# Patient Record
Sex: Female | Born: 1960 | Race: Black or African American | Hispanic: No | Smoking: Never smoker
Health system: Southern US, Community
[De-identification: ages and names within clinical notes are randomized; demographics above are authoritative.]

## PROBLEM LIST (undated history)

## (undated) DIAGNOSIS — I10 Essential (primary) hypertension: Secondary | ICD-10-CM

## (undated) DIAGNOSIS — E119 Type 2 diabetes mellitus without complications: Secondary | ICD-10-CM

## (undated) HISTORY — PX: ABDOMINAL HYSTERECTOMY: SHX81

---

## 1997-04-04 ENCOUNTER — Ambulatory Visit (HOSPITAL_COMMUNITY): Admission: RE | Admit: 1997-04-04 | Discharge: 1997-04-04 | Payer: Self-pay | Admitting: Obstetrics and Gynecology

## 1997-10-28 ENCOUNTER — Emergency Department (HOSPITAL_COMMUNITY): Admission: EM | Admit: 1997-10-28 | Discharge: 1997-10-28 | Payer: Self-pay | Admitting: Emergency Medicine

## 1997-12-19 ENCOUNTER — Other Ambulatory Visit: Admission: RE | Admit: 1997-12-19 | Discharge: 1997-12-19 | Payer: Self-pay | Admitting: Obstetrics and Gynecology

## 1998-01-03 ENCOUNTER — Ambulatory Visit (HOSPITAL_COMMUNITY): Admission: RE | Admit: 1998-01-03 | Discharge: 1998-01-03 | Payer: Self-pay | Admitting: Obstetrics and Gynecology

## 1998-12-03 ENCOUNTER — Other Ambulatory Visit: Admission: RE | Admit: 1998-12-03 | Discharge: 1998-12-03 | Payer: Self-pay | Admitting: Obstetrics and Gynecology

## 1999-12-13 ENCOUNTER — Other Ambulatory Visit: Admission: RE | Admit: 1999-12-13 | Discharge: 1999-12-13 | Payer: Self-pay | Admitting: Obstetrics and Gynecology

## 2000-01-14 ENCOUNTER — Encounter: Payer: Self-pay | Admitting: Obstetrics and Gynecology

## 2000-01-21 ENCOUNTER — Encounter (INDEPENDENT_AMBULATORY_CARE_PROVIDER_SITE_OTHER): Payer: Self-pay

## 2000-01-21 ENCOUNTER — Inpatient Hospital Stay (HOSPITAL_COMMUNITY): Admission: RE | Admit: 2000-01-21 | Discharge: 2000-01-23 | Payer: Self-pay | Admitting: Obstetrics and Gynecology

## 2000-01-25 ENCOUNTER — Inpatient Hospital Stay (HOSPITAL_COMMUNITY): Admission: EM | Admit: 2000-01-25 | Discharge: 2000-01-27 | Payer: Self-pay | Admitting: Emergency Medicine

## 2000-01-25 ENCOUNTER — Encounter: Payer: Self-pay | Admitting: Obstetrics and Gynecology

## 2000-01-25 ENCOUNTER — Encounter: Payer: Self-pay | Admitting: Emergency Medicine

## 2002-07-06 ENCOUNTER — Encounter: Payer: Self-pay | Admitting: Family Medicine

## 2002-07-06 ENCOUNTER — Encounter: Admission: RE | Admit: 2002-07-06 | Discharge: 2002-07-06 | Payer: Self-pay | Admitting: Family Medicine

## 2003-03-11 ENCOUNTER — Emergency Department (HOSPITAL_COMMUNITY): Admission: EM | Admit: 2003-03-11 | Discharge: 2003-03-12 | Payer: Self-pay | Admitting: Emergency Medicine

## 2004-03-05 ENCOUNTER — Encounter: Admission: RE | Admit: 2004-03-05 | Discharge: 2004-03-05 | Payer: Self-pay | Admitting: Family Medicine

## 2005-04-14 ENCOUNTER — Encounter: Admission: RE | Admit: 2005-04-14 | Discharge: 2005-04-14 | Payer: Self-pay | Admitting: Family Medicine

## 2005-07-18 ENCOUNTER — Encounter: Admission: RE | Admit: 2005-07-18 | Discharge: 2005-07-18 | Payer: Self-pay | Admitting: Family Medicine

## 2005-10-08 ENCOUNTER — Other Ambulatory Visit: Admission: RE | Admit: 2005-10-08 | Discharge: 2005-10-08 | Payer: Self-pay | Admitting: Obstetrics and Gynecology

## 2006-04-20 ENCOUNTER — Encounter: Admission: RE | Admit: 2006-04-20 | Discharge: 2006-04-20 | Payer: Self-pay | Admitting: Family Medicine

## 2007-04-12 ENCOUNTER — Encounter: Admission: RE | Admit: 2007-04-12 | Discharge: 2007-04-12 | Payer: Self-pay | Admitting: Family Medicine

## 2008-04-13 ENCOUNTER — Encounter: Admission: RE | Admit: 2008-04-13 | Discharge: 2008-04-13 | Payer: Self-pay | Admitting: Family Medicine

## 2008-08-03 ENCOUNTER — Emergency Department (HOSPITAL_COMMUNITY): Admission: EM | Admit: 2008-08-03 | Discharge: 2008-08-03 | Payer: Self-pay | Admitting: Family Medicine

## 2009-04-25 ENCOUNTER — Encounter: Admission: RE | Admit: 2009-04-25 | Discharge: 2009-04-25 | Payer: Self-pay | Admitting: Family Medicine

## 2010-07-12 NOTE — H&P (Signed)
[facility]  Patient:    Jacqueline [name]                  MRN: [id number] Adm. Date:  [id number] Attending:  Jenean Lindau                         History and Physical  IDENTIFYING DATA:  Jacqueline [name] is a 50 year old female with enlarging symptomatic fibroids, admitted for a total abdominal hysterectomy.  HISTORY OF PRESENT ILLNESS:  Jacqueline [name] is a 50 year old, gravida 2, para 2, single, black female, who is status post tubal ligation at the time of her cesarean section in 1994.  She has been followed by the physician since February of 1998 for routine gynecologic care.  At the time of her presentation, she was having regular light menses and was noted to have a slightly irregular uterus that was not significantly enlarged.  She was first noted to have an enlarged uterus on examination in January of 1999, at which time she was felt to have fibroids.  A pelvic ultrasound at that time revealed a calcified fibroid on the left lateral aspect, measuring 4.5 cm in greatest dimension and a posterior fibroid measuring 1.5 cm in greatest dimension.  At that time, she was noticing some increase in her menstrual flow that was not intolerable.  Her hemoglobin was borderline at 12.2.  She was monitored and it was felt that within the year that her fibroids had remained stable.  They were difficult to assess due to her obesity.  Successive ultrasounds performed in November of 1999 and again in May of 2001 revealed progressive growth of the fibroids.  Her most recent ultrasound revealed four dominant fibroids, all of which had increased in size over the past two years.  In the interim, the patient was noting more urinary frequency with the need to void up to every hour, nocturia three times nightly, and continuing regular menses, but a marked increase in her flow with cramps, clots, and also increasing pressure and dyspareunia.  When she presented in October  of this year for follow-up, her fibroids were felt to be 16 weeks plus size, which represented a slight increase in size.  At this time, she was offered multiple alternatives, including continued follow-up versus embolization, ablation, myomectomy, and hysterectomy.  She was no longer desirous of childbearing.  She elected to proceed to definitive surgical management.  The alternative of Lupron to shrink the fibroids and attempt at vaginal approach was discussed, but it was felt that this was not likely due to her prior C-section, her very small pelvis, and the excellent uterine support.  It was felt that the fibroids were of too large a size to be shrunk sufficiently to allow vaginal approach.  The patient elected to proceed with abdominal hysterectomy and presents now for definitive surgical management.  PAST MEDICAL HISTORY:  1. Hypertension.  2. Fibrocystic breast changes.  PAST SURGICAL HISTORY:  1. Cesarean section x 1 with bilateral tubal ligation. 2. Excision of lipoma of right axilla.  PAST OBSTETRICAL HISTORY:  Vaginal delivery x 1 in 1983.  Cesarean section for fetal distress in 1994 with tubal ligation.  ALLERGIES:  None known drug allergies or latex sensitivities.  CURRENT MEDICATIONS: 1. Accupril 40 mg daily. 2. Hydrochlorothiazide 25 mg daily. 3. Over-the-counter potassium once daily.  TRANSFUSION HISTORY:  Negative.  FAMILY HISTORY:  Positive for diabetes in her sister and hypertension in her mother.  SOCIAL HISTORY:  The patient is single and works at [employer].  She has two children.  She has been in a long-term, steady relationship.  She denies smoking, alcohol, or illicit drug use.  REVIEW OF SYSTEMS:  Notable for the history of present illness.  She denies an intermenstrual or abnormal bleeding.  She has no symptoms of pelvic prolapse or bowel complaints.  Cardiovascular and respiratory review of systems negative.  Last Pap smear in October of this year  within normal limits. Baseline mammogram in November of 1999 within normal limits.  PHYSICAL EXAMINATION:  The physical exam performed prior to admission revealed an obese black female in no apparent distress.  HEIGHT:  5 feet 3 inches.  WEIGHT:  186 pounds.  VITAL SIGNS:  Blood pressure 122/72.  HEENT:  Grossly negative.  Oropharynx clear.  NECK:  Supple without thyromegaly or lymphadenopathy.  BACK:  Without spine or CVA tenderness.  LUNGS:  Clear to auscultation.  HEART:  Regular rate and rhythm without murmurs, rubs, or gallops.  Carotids +2 and equal without bruit.  Distal pulses full.  BREASTS:  Without masses.  ABDOMEN:  Without hepatosplenomegaly or tenderness.  There was a well-healed Pfannenstiel scar.  There was a suprapubic mass arising midway to the umbilicus consistent with fibroids.  PELVIC:  Normal female external genitalia.  Clean vagina and cervix.  Uterus 14-16 weeks size and poorly mobile with no obvious adnexal masses. Rectovaginal confirmatory.  EXTREMITIES:  Without edema.  NEUROLOGIC:  Grossly nonfocal.  LABORATORY STUDIES ON ADMISSION:  Hemoglobin 12.6, hematocrit 36.6, normal white count of 10.0, platelets 288.  Normal coagulation studies.  Normal comprehensive metabolic profile.  Negative urinalysis.  Chest x-ray with no active disease.  EKG with normal sinus rhythm with no prior EKG for comparison.  IMPRESSION ON ADMISSION: 1. Leiomyoma uteri, increasing size and pressure symptoms. 2. Hypertension, well controlled. 3. Status post cesarean section x 1.  PLAN:  The patient is admitted for same-day surgery.  She will undergo a TAH. She has been extensively counseled as to the risks, benefits, alternatives, and complications and understands that this renders her permanently and irreversibly sterilized. DD:  01/21/00 TD:  01/21/00 Job: [id number]

## 2010-07-12 NOTE — H&P (Signed)
[facility]  Patient:    Jacqueline [name], Jacqueline [name]                  MRN: [id number] Adm. Date:  [id number] Attending:  Jenean Lindau CC:         Laqueta Linden, M.D.   History and Physical  CHIEF COMPLAINT:  Pain, nausea and vomiting.  HISTORY:  The patient is a 50 year old, patient of Dr. Myrlene Broker, who underwent an abdominal hysterectomy on January 21, 2000 for leiomyomata uteri.  She did well until the mid day on January 24, 2000, at which time she began experiencing more abdominal pain, and nausea and vomiting.  She was seen in the office by Dr. Lonn Georgia PA or nurse practitioner on that date and advised to use laxatives to improve her situation.  The pain became worse in the early morning hours of January 25, 2000 and she presented at the emergency room with this problem. Evaluation here has shown hemoglobin to be 12.5 g, 17,400 white blood cells with shift to the left with 88% segmental neutrophils.  She also underwent a CT scan which showed no problems with the hysterectomy site but that there was edema of small bowel wall.  Urinalysis was negative. She is admitted at this time for intravenous fluids, observation, an antibiotics for presumed peritonitis postoperatively.  PAST MEDICAL HISTORY:  She had a C-section in the past.  ALLERGIES:  She is allergic to no medications.  MEDICATIONS:  She takes Accupril 40 mg daily for hypertension.  FAMILY HISTORY:  Essentially noncontributory.  REVIEW OF SYSTEMS:  HEENT: Within normal limits. CARDIORESPIRATORY:  Some cough with slight productive nature.  GASTROINTESTINAL: As noted above. GENITOURINARY:  As noted above.  NEUROLOGIC/MUSCULOSKELETAL:  Negative.  PHYSICAL EXAMINATION:  VITAL SIGNS:  Blood pressure 139/99, pulse 105, respirations 16, temperature 97 on admission to the emergency room.  GENERAL: Well-developed black female in mild distress.  HEENT:  Within normal limits.  NECK:  Supple  without masses, adenopathy or bruits.  HEART: Tachycardia but without murmurs.  LUNGS: Clear to auscultation.  ABDOMEN: Markedly distended, very tender throughout with rebound throughout. No mass palpable.  The lower abdominal transverse incision is healing normally for four days postoperatively.  PELVIC:  No mass but tender.  EXTREMITIES:  Within normal limits.  CENTRAL NERVOUS SYSTEM:  Grossly intact.  SKIN: Without suspicious lesions.  IMPRESSION:  Status post abdominal hysterectomy on January 21, 2000. Question postoperative peritonitis.  Rule out pneumonia.  PLAN:  Admit. DD:  01/25/00 TD:  01/25/00 Job: [id number]

## 2010-07-12 NOTE — Op Note (Signed)
[facility]  Patient:    Jacqueline [name], Jacqueline [name]                  MRN: [id number] Proc. Date: 01/21/00 Adm. Date:  [id number] Attending:  Jenean Lindau                           Operative Report  PREOPERATIVE DIAGNOSIS:  Leiomyomata uteri  POSTOPERATIVE DIAGNOSIS:  Leiomyomata uteri.  OPERATION:   Total abdominal hysterectomy.  SURGEON:  Laqueta Linden, M.D.  ASSISTANT:  Andres Ege, M.D.  ANESTHESIA: General endotracheal anesthesia.  ESTIMATED BLOOD LOSS:  100 cc.  URINE OUTPUT:  75 cc.  FLUIDS:  2 liters crystalloid.  COUNTS:  Correct x 2.  COMPLICATIONS: None.  INDICATIONS:  Jacqueline [name] is a 50 year old, gravida 2, para 2, female with enlarging, increasingly symptomatic fibroids.  She has had a tubal ligation and does not desire further childbearing.  She has been assessing alternative options including myomectomy, embolization, hysteroscopic resection, Lupron, and desires to proceed with definitive surgical management.  She has been extensively counselled as to the risks, benefits, alternatives, and complications including permanent and irreversible sterilization as a result. She has voiced her understanding and has given full consent.  She has received Ancef 1 g antibiotic prophylaxis preoperatively.  DESCRIPTION OF PROCEDURE: The patient was taken to the operating room after identification and consents were ascertained.  She was placed on the operating table in supine position.  After the induction of general endotracheal anesthesia, she was placed in the frogleg position, and the abdomen, perineum, and vagina were prepped and draped in routine sterile fashion.  Foley was placed.  A Pfannenstiel incision was made through the patients prior Pfannenstiel incision for C section.  Dissection was carried down to the fascia which was incised and incision extended laterally, superiorly, and inferiorly.  The rectus muscles were  separated laterally.  The parietal peritoneum was elevated and incised and the incision extended superiorly and inferiorly to the level of the bladder.  Palpation of the upper abdomen revealed smooth renal contours bilaterally, smooth liver edge with no palpable gallstones, and a normal appearing appendix.  Self-retaining retractor was then placed and bowel packed into the upper abdomen using moistened laparotomy packs.  Inspection of the pelvis revealed a diffusely enlarged uterus distorted by multiple fibroids.  Tubes and ovaries appeared completely normal with a few filmy adhesions bilaterally.  The bladder was densely adherent to the lower uterine segment consistent with her history of a cesarean birth. The uterus was elevated into the operative field using curved Kelly clamps. The round ligaments were suture ligated bilaterally and then transected using monopolar cautery with dissection carried forward in the anterior leaf of the broad ligament to where the bladder was adherent.  A window was made in the posterior broad ligament with a curved Heaney clamp placed across the proximal fallopian tube and utero-ovarian ligament with pedicle cut, doubly ligated with a free time and then a stitch of O Vicryl.  In this fashion, both adnexa were retained as appropriate for this patients young age.  After this was done on both sides, the uterine vessels were skeletonized.  Sharp dissection was then performed to try to identify a tissue plane to dissect the bladder off the lower uterine segment and cervix.  This was somewhat difficult, and great care was taken not to injure the bladder.  At this point, the uterine vessels were skeletonized and  were clamped perpendicularly at the level of the internal os with the pedicles cut and suture ligated.  After the bladder was advanced by sharp and blunt dissection further off of the cervix, additional straight Heaney clamps were placed across the cardinal  ligaments bilaterally with pedicles cut and suture ligated.  Curved Heaney clamps were placed across the upper vaginal angles and uterosacral ligaments bilaterally with pedicles cut and Heaney ligated.  At this point, the Satinsky scissors were used to circumscribe the cervix with removal of the entire specimen which was sent to pathology for final sectioning.  Richardson angled sutures were placed at both angles with excellent hemostasis noted.  The remainder of the vaginal cuff was closed from front to back using interrupted figure-of-eight sutures of 0 Vicryl.  Copious lavage was accomplished.  Several small bleeding points were cauterized.  The bladder was filled retrograde with methylene blue stained fluid to greater than 200 cc capacity and was noted to be intact with no evidence of bladder injury identified.  The fluid was then allowed to escape. The vaginal cuff was noted to be hemostatic.  The uterosacral ligaments were plicated posteriorly to prevent enterocele formation.  Both ureters were visualized, of normal caliber and peristalsis at the conclusion of the procedure.  The ovaries were then suspended to the round ligaments on their sides.  All pedicles were noted to be hemostatic.  Copious lavage was accomplished, and the cuff was hemostatic as well.  The self-retaining retractor and laparotomy packs were then removed.  The parietal peritoneum was closed in a running fashion using 2-0 Vicryl suture.  The rectus muscles were loosely reapproximated in the midline.  Subfascial hemostasis was ascertained. Then the fascia was closed from both lateral aspects to the midline using running stitch of 0 Maxon which were tied together in the midline. Subcutaneous hemostasis was ascertained.  The skin was closed with staples. Steri-Strips and pressure dressing were applied.  The patient was stable and extubated on transfer to the recovery room.  Estimated blood loss 100 cc. Urine output 75  cc.  Fluids 2 liters crystalloid.  Counts correct x 2. Complications none. DD:  01/21/00 TD:  01/21/00 Job: [id number]

## 2010-07-12 NOTE — Discharge Summary (Signed)
[facility]  Patient:    TANAYSHA, ALKINS                  MRN: [id number] Adm. Date:  [id number] Disc. Date: [id number] Attending:  Jenean Lindau                           Discharge Summary  DISCHARGE DIAGNOSES:  Postoperative ileus versus peritonitis, clinically resolved.  PROCEDURES:  CT of the abdomen and pelvis.  HISTORY OF PRESENT ILLNESS:  Sahirah Rudell is a 50 year old female who underwent a total abdominal hysterectomy on January 21, 2000 for fibroids. She presented with nausea, vomiting, and worsening abdominal pain with a maximum temperature of 99.5 and was evaluated and admitted by Dr. Jeanine Luz who was on-call. CT of the abdomen and pelvis was performed which revealed no evidence of hematoma or fluid collection; however, there was some edema of the small bowel wall. Other workup revealed a stable hemoglobin at 12.5, a white count of 17.4 with 88% segs consistent with a left shift.  The patient was admitted per Dr. Randell Patient and was started on IV ampicillin, gentamycin and clindamycin. She was initially maintained n.p.o. but gradually started taking p.o. fluids on hospital day #2. She passed flatus, had a small bowel movement and her white count had decreased to 12.5. Her diet was advanced. She remained afebrile for the duration of her hospital stay. She was evaluated again on hospital day #2, postoperative day #6 and reported that her pain had decreased by at least 1/2. She was tolerating a regular diet, had slight nausea without vomiting and had several normal bowel movements and was passing flatus. Her incision appeared to be healing well without evidence of cellulitis. She had normal bowel sounds and a soft nontender abdomen.  She was observed until the afternoon of discharge after switching to oral Augmentin and to ibuprofen. She declined additional narcotics as they made her feel woozy. She was discharged home the afternoon of  hospital day #2 in improved condition. She was given a prescription for Augmentin 875 mg b.i.d. to complete a 10 day course, told to take over the counter ibuprofen up to 800 mg q. 8h with food and she had Darvocet at home that she could take as needed for additional pain. She is to follow-up in the office in 1 week or to call immediately for fever, worsening pain, nausea, vomiting, constipation or other concerns.  CONDITION ON DISCHARGE:  Improved. DD:  02/07/00 TD:  02/07/00 Job: [id number]

## 2010-10-01 ENCOUNTER — Other Ambulatory Visit: Payer: Self-pay | Admitting: Family Medicine

## 2010-10-01 DIAGNOSIS — Z1231 Encounter for screening mammogram for malignant neoplasm of breast: Secondary | ICD-10-CM

## 2010-10-10 ENCOUNTER — Ambulatory Visit: Payer: Self-pay

## 2011-07-29 ENCOUNTER — Ambulatory Visit: Payer: Self-pay

## 2011-09-26 ENCOUNTER — Ambulatory Visit: Payer: Self-pay

## 2011-10-03 ENCOUNTER — Ambulatory Visit
Admission: RE | Admit: 2011-10-03 | Discharge: 2011-10-03 | Disposition: A | Discharge status: Home / Self Care | Payer: Self-pay | Source: Ambulatory Visit | Attending: Family Medicine | Admitting: Family Medicine

## 2011-10-03 DIAGNOSIS — Z1231 Encounter for screening mammogram for malignant neoplasm of breast: Secondary | ICD-10-CM

## 2011-10-08 ENCOUNTER — Other Ambulatory Visit: Payer: Self-pay | Admitting: Family Medicine

## 2011-10-08 DIAGNOSIS — R928 Other abnormal and inconclusive findings on diagnostic imaging of breast: Secondary | ICD-10-CM

## 2011-10-14 ENCOUNTER — Ambulatory Visit
Admission: RE | Admit: 2011-10-14 | Discharge: 2011-10-14 | Disposition: A | Discharge status: Home / Self Care | Payer: Self-pay | Source: Ambulatory Visit | Attending: Family Medicine | Admitting: Family Medicine

## 2011-10-14 DIAGNOSIS — R928 Other abnormal and inconclusive findings on diagnostic imaging of breast: Secondary | ICD-10-CM

## 2013-01-26 ENCOUNTER — Other Ambulatory Visit (HOSPITAL_COMMUNITY): Payer: Self-pay | Admitting: Family Medicine

## 2013-01-26 ENCOUNTER — Other Ambulatory Visit: Payer: Self-pay

## 2013-01-26 DIAGNOSIS — Z1231 Encounter for screening mammogram for malignant neoplasm of breast: Secondary | ICD-10-CM

## 2013-02-09 ENCOUNTER — Ambulatory Visit (HOSPITAL_COMMUNITY)
Admission: RE | Admit: 2013-02-09 | Discharge: 2013-02-09 | Disposition: A | Discharge status: Home / Self Care | Payer: 59 | Source: Ambulatory Visit | Attending: Family Medicine | Admitting: Family Medicine

## 2013-02-09 DIAGNOSIS — Z1231 Encounter for screening mammogram for malignant neoplasm of breast: Secondary | ICD-10-CM

## 2013-02-25 ENCOUNTER — Ambulatory Visit: Payer: Self-pay

## 2014-07-23 ENCOUNTER — Encounter (HOSPITAL_BASED_OUTPATIENT_CLINIC_OR_DEPARTMENT_OTHER): Payer: Self-pay | Admitting: Emergency Medicine

## 2014-07-23 ENCOUNTER — Emergency Department (HOSPITAL_BASED_OUTPATIENT_CLINIC_OR_DEPARTMENT_OTHER)
Admission: EM | Admit: 2014-07-23 | Discharge: 2014-07-23 | Disposition: A | Discharge status: Home / Self Care | Payer: 59 | Attending: Emergency Medicine | Admitting: Emergency Medicine

## 2014-07-23 ENCOUNTER — Emergency Department (HOSPITAL_BASED_OUTPATIENT_CLINIC_OR_DEPARTMENT_OTHER): Payer: 59

## 2014-07-23 DIAGNOSIS — R000 Tachycardia, unspecified: Secondary | ICD-10-CM | POA: Insufficient documentation

## 2014-07-23 DIAGNOSIS — R609 Edema, unspecified: Secondary | ICD-10-CM

## 2014-07-23 DIAGNOSIS — L03115 Cellulitis of right lower limb: Secondary | ICD-10-CM | POA: Insufficient documentation

## 2014-07-23 DIAGNOSIS — I10 Essential (primary) hypertension: Secondary | ICD-10-CM | POA: Diagnosis not present

## 2014-07-23 DIAGNOSIS — M79661 Pain in right lower leg: Secondary | ICD-10-CM | POA: Diagnosis present

## 2014-07-23 HISTORY — DX: Essential (primary) hypertension: I10

## 2014-07-23 LAB — CBC
HEMATOCRIT: 40.4 % (ref 36.0–46.0)
Hemoglobin: 13.4 g/dL (ref 12.0–15.0)
MCH: 28.7 pg (ref 26.0–34.0)
MCHC: 33.2 g/dL (ref 30.0–36.0)
MCV: 86.5 fL (ref 78.0–100.0)
PLATELETS: 311 10*3/uL (ref 150–400)
RBC: 4.67 MIL/uL (ref 3.87–5.11)
RDW: 13.4 % (ref 11.5–15.5)
WBC: 22.9 10*3/uL — ABNORMAL HIGH (ref 4.0–10.5)

## 2014-07-23 LAB — BASIC METABOLIC PANEL
ANION GAP: 10 (ref 5–15)
BUN: 12 mg/dL (ref 6–20)
CHLORIDE: 99 mmol/L — AB (ref 101–111)
CO2: 26 mmol/L (ref 22–32)
CREATININE: 0.72 mg/dL (ref 0.44–1.00)
Calcium: 9.1 mg/dL (ref 8.9–10.3)
GFR calc Af Amer: >60 mL/min (ref >60)
GFR calc non Af Amer: >60 mL/min (ref >60)
GLUCOSE: 135 mg/dL — AB (ref 65–99)
POTASSIUM: 3.6 mmol/L (ref 3.5–5.1)
Sodium: 135 mmol/L (ref 135–145)

## 2014-07-23 MED ORDER — SODIUM CHLORIDE 0.9 % IV BOLUS (SEPSIS)
500.0000 mL | Freq: Once | INTRAVENOUS | Status: AC
  Administered 2014-07-23: 500 mL via INTRAVENOUS

## 2014-07-23 MED ORDER — CLINDAMYCIN PHOSPHATE 600 MG/50ML IV SOLN
600.0000 mg | Freq: Once | INTRAVENOUS | Status: AC
  Administered 2014-07-23: 600 mg via INTRAVENOUS
  Filled 2014-07-23: qty 50

## 2014-07-23 MED ORDER — MORPHINE SULFATE 4 MG/ML IJ SOLN
4.0000 mg | Freq: Once | INTRAMUSCULAR | Status: AC
  Administered 2014-07-23: 4 mg via INTRAVENOUS
  Filled 2014-07-23: qty 1

## 2014-07-23 MED ORDER — CLINDAMYCIN HCL 300 MG PO CAPS: 300.0000 mg | ORAL_CAPSULE | Freq: Four times a day (QID) | ORAL | Status: DC

## 2014-07-23 MED ORDER — ACETAMINOPHEN 325 MG PO TABS
650.0000 mg | ORAL_TABLET | Freq: Once | ORAL | Status: AC
  Administered 2014-07-23: 650 mg via ORAL
  Filled 2014-07-23: qty 2

## 2014-07-23 MED ORDER — OXYCODONE-ACETAMINOPHEN 5-325 MG PO TABS: 1.0000 | ORAL_TABLET | Freq: Four times a day (QID) | ORAL | Status: DC | PRN

## 2014-07-23 NOTE — ED Notes (Signed)
sm round, raised area noted on medial aspect of Lt knee area

## 2014-07-23 NOTE — ED Notes (Signed)
Red area on RLE marked

## 2014-07-23 NOTE — ED Notes (Signed)
Pt in c/o R leg pain, erythema and swelling. Saw primary today who sent her here for evaluation of DVT/cellulitis.

## 2014-07-23 NOTE — ED Notes (Signed)
Red raised area at medial of Rt knee measured, approx 2mm x 2mm

## 2014-07-23 NOTE — ED Notes (Signed)
MD at bedside. 

## 2014-07-23 NOTE — ED Notes (Signed)
Pt c/o Rt lower leg pain, leg noted to be red in color, swollen in color, states having fever and chills

## 2014-07-23 NOTE — ED Provider Notes (Signed)
CSN: [id number]     Arrival date & time 07/23/14  1608 History   First MD Initiated Contact with Patient 07/23/14 1658     Chief Complaint  Patient presents with  . Leg Pain     (Consider location/radiation/quality/duration/timing/severity/associated sxs/prior Treatment) HPI Comments: 54 year old female complaining of right lower extremity pain 2 days. Reports pain is increasing, described as throbbing and tight, starting to turn red, warm and swollen, worse with pressure. She went to a walk-in clinic today who told her to go to the emergency department for evaluation of a DVT versus cellulitis. Reports subjective fever and chills. No injury or trauma. No history of DVT. Denies chest pain or shortness of breath. Cannot recall being bit by anything.  Patient is a 54 y.o. female presenting with leg pain. The history is provided by the patient.  Leg Pain Associated symptoms: fever     Past Medical History  Diagnosis Date  . Hypertension    Past Surgical History  Procedure Laterality Date  . Cesarean section    . Abdominal hysterectomy     History reviewed. No pertinent family history. History  Substance Use Topics  . Smoking status: Never Smoker   . Smokeless tobacco: Never Used  . Alcohol Use: No   OB History    No data available     Review of Systems  Constitutional: Positive for fever.  Musculoskeletal:       +R leg pain and swelling.  Skin: Positive for color change.  All other systems reviewed and are negative.     Allergies  Review of patient's allergies indicates no known allergies.  Home Medications   Prior to Admission medications   Medication Sig Start Date End Date Taking? Authorizing Provider  clindamycin (CLEOCIN) 300 MG capsule Take 1 capsule (300 mg total) by mouth 4 (four) times daily. X 7 days 07/23/14   Kathrynn Speedobyn M Esme Durkin, PA-C  oxyCODONE-acetaminophen (PERCOCET) 5-325 MG per tablet Take 1-2 tablets by mouth every 6 (six) hours as needed for severe  pain. 07/23/14   Addysyn Fern M Rainbow Salman, PA-C   BP 141/75 mmHg  Pulse 106  Temp(Src) 99.1 F (37.3 C) (Oral)  Resp 18  Ht 5\' 3"  (1.6 m)  Wt 230 lb (104.327 kg)  BMI 40.75 kg/m2  SpO2 97% Physical Exam  Constitutional: She is oriented to person, place, and time. She appears well-developed and well-nourished. No distress.  HENT:  Head: Normocephalic and atraumatic.  Mouth/Throat: Oropharynx is clear and moist.  Eyes: Conjunctivae and EOM are normal.  Neck: Normal range of motion. Neck supple.  Cardiovascular: Regular rhythm and normal heart sounds.  Tachycardia present.   Pulses:      Dorsalis pedis pulses are 1+ on the right side, and 2+ on the left side.       Posterior tibial pulses are 1+ on the right side, and 2+ on the left side.  Pulmonary/Chest: Effort normal and breath sounds normal. No respiratory distress.  Musculoskeletal: Normal range of motion. She exhibits no edema.  Neurological: She is alert and oriented to person, place, and time. No sensory deficit.  Skin: Skin is warm and dry.  Large 15 cm x 7 cm area of erythema on anterior aspect of RLE with swelling and warmth. Tender. Mild calf tenderness. No palpable cords. Small, 2 mm x 2 mm red, raised area medial to R knee. Mild tenderness.  Psychiatric: She has a normal mood and affect. Her behavior is normal.  Nursing note and vitals reviewed.  ED Course  Procedures (including critical care time) Labs Review Labs Reviewed  CBC - Abnormal; Notable for the following:    WBC 22.9 (*)    All other components within normal limits  BASIC METABOLIC PANEL - Abnormal; Notable for the following:    Chloride 99 (*)    Glucose, Bld 135 (*)    All other components within normal limits    Imaging Review US Venous Img Lower Unilateral Right  07/23/2014   CLINICAL DATA:  Right medial thigh and medial calf pain and swelling 1 day, but bite right lower extremity  EXAM: RIGHT LOWER EXTREMITY VENOUS DOPPLER ULTRASOUND  TECHNIQUE:  Gray-scale sonography with graded compression, as well as color Doppler and duplex ultrasound were performed to evaluate the lower extremity deep venous systems from the level of the common femoral vein and including the common femoral, femoral, profunda femoral, popliteal and calf veins including the posterior tibial, peroneal and gastrocnemius veins when visible. The superficial great saphenous vein was also interrogated. Spectral Doppler was utilized to evaluate flow at rest and with distal augmentation maneuvers in the common femoral, femoral and popliteal veins.  COMPARISON:  None.  FINDINGS: Contralateral Common Femoral Vein: Respiratory phasicity is normal and symmetric with the symptomatic side. No evidence of thrombus. Normal compressibility.  Common Femoral Vein: No evidence of thrombus. Normal compressibility, respiratory phasicity and response to augmentation.  Saphenofemoral Junction: No evidence of thrombus. Normal compressibility and flow on color Doppler imaging.  Profunda Femoral Vein: No evidence of thrombus. Normal compressibility and flow on color Doppler imaging.  Femoral Vein: No evidence of thrombus. Normal compressibility, respiratory phasicity and response to augmentation.  Popliteal Vein: No evidence of thrombus. Normal compressibility, respiratory phasicity and response to augmentation.  Calf Veins: No evidence of thrombus. Normal compressibility and flow on color Doppler imaging.  Superficial Great Saphenous Vein: No evidence of thrombus. Normal compressibility and flow on color Doppler imaging.  Venous Reflux:  None.  Other Findings:  None.  IMPRESSION: No evidence of deep venous thrombosis.   Electronically Signed   By: Esperanza Heir M.D.   On: 07/23/2014 18:12     EKG Interpretation None      MDM   Final diagnoses:  Cellulitis of right lower leg   Nontoxic appearing, NAD. Afebrile. Mildly tachycardic. Vitals otherwise stable. Based on appearance, this is most likely  cellulitis, however given calf tenderness, and initial concern for cellulitis versus DVT, ultrasound obtained to rule out DVT. Ultrasound negative. Blood work significant for leukocytosis of 22.9. No other acute findings. Patient received a dose of IV clindamycin in the ED along with pain medicine with significant relief of her symptoms. Skin markings drawn around the area of erythema. There is no streaking and erythema does not cross joint line. Will discharge home with clindamycin and Percocet. Advised follow-up with her PCP in 2-3 days for recheck. Strict return cautions given. Stable for discharge. Patient states understanding of plan and is agreeable.  Discussed with attending Dr. Gwendolyn Grant who agrees with plan of care.   Kathrynn Speed, PA-C 07/23/14 1836  Elwin Mocha, MD 07/23/14 [phone]

## 2014-07-23 NOTE — Discharge Instructions (Signed)
Take clindamycin as directed for 1 week. It is important to complete the entire course of the anti-biotic. Return to the emergency department if the redness spreads beyond the markings drawn or if you get streaking up her leg. Take percocet for severe pain only. No driving or operating heavy machinery while taking percocet. This medication may cause drowsiness. Keep your leg elevated.  Cellulitis Cellulitis is an infection of the skin and the tissue beneath it. The infected area is usually red and tender. Cellulitis occurs most often in the arms and lower legs.  CAUSES  Cellulitis is caused by bacteria that enter the skin through cracks or cuts in the skin. The most common types of bacteria that cause cellulitis are staphylococci and streptococci. SIGNS AND SYMPTOMS   Redness and warmth.  Swelling.  Tenderness or pain.  Fever. DIAGNOSIS  Your health care provider can usually determine what is wrong based on a physical exam. Blood tests may also be done. TREATMENT  Treatment usually involves taking an antibiotic medicine. HOME CARE INSTRUCTIONS   Take your antibiotic medicine as directed by your health care provider. Finish the antibiotic even if you start to feel better.  Keep the infected arm or leg elevated to reduce swelling.  Apply a warm cloth to the affected area up to 4 times per day to relieve pain.  Take medicines only as directed by your health care provider.  Keep all follow-up visits as directed by your health care provider. SEEK MEDICAL CARE IF:   You notice red streaks coming from the infected area.  Your red area gets larger or turns dark in color.  Your bone or joint underneath the infected area becomes painful after the skin has healed.  Your infection returns in the same area or another area.  You notice a swollen bump in the infected area.  You develop new symptoms.  You have a fever. SEEK IMMEDIATE MEDICAL CARE IF:   You feel very sleepy.  You  develop vomiting or diarrhea.  You have a general ill feeling (malaise) with muscle aches and pains. MAKE SURE YOU:   Understand these instructions.  Will watch your condition.  Will get help right away if you are not doing well or get worse. Document Released: 11/20/2004 Document Revised: 06/27/2013 Document Reviewed: 04/28/2011 [facility] Patient Information 2015 [employer] [place]. This information is not intended to replace advice given to you by your health care provider. Make sure you discuss any questions you have with your health care provider.

## 2014-07-23 NOTE — ED Notes (Signed)
EDP notified immediately of assessment

## 2014-11-27 ENCOUNTER — Other Ambulatory Visit: Payer: Self-pay

## 2014-11-27 DIAGNOSIS — Z1231 Encounter for screening mammogram for malignant neoplasm of breast: Secondary | ICD-10-CM

## 2014-12-06 ENCOUNTER — Ambulatory Visit
Admission: RE | Admit: 2014-12-06 | Discharge: 2014-12-06 | Disposition: A | Discharge status: Home / Self Care | Payer: 59 | Source: Ambulatory Visit

## 2014-12-06 DIAGNOSIS — Z1231 Encounter for screening mammogram for malignant neoplasm of breast: Secondary | ICD-10-CM

## 2015-10-30 ENCOUNTER — Emergency Department (HOSPITAL_BASED_OUTPATIENT_CLINIC_OR_DEPARTMENT_OTHER)
Admission: EM | Admit: 2015-10-30 | Discharge: 2015-10-30 | Disposition: A | Discharge status: Home / Self Care | Payer: Self-pay | Attending: Emergency Medicine | Admitting: Emergency Medicine

## 2015-10-30 ENCOUNTER — Emergency Department (HOSPITAL_BASED_OUTPATIENT_CLINIC_OR_DEPARTMENT_OTHER): Payer: Self-pay

## 2015-10-30 ENCOUNTER — Encounter (HOSPITAL_BASED_OUTPATIENT_CLINIC_OR_DEPARTMENT_OTHER): Payer: Self-pay | Admitting: *Deleted

## 2015-10-30 DIAGNOSIS — Z79899 Other long term (current) drug therapy: Secondary | ICD-10-CM | POA: Insufficient documentation

## 2015-10-30 DIAGNOSIS — M79641 Pain in right hand: Secondary | ICD-10-CM | POA: Insufficient documentation

## 2015-10-30 DIAGNOSIS — I10 Essential (primary) hypertension: Secondary | ICD-10-CM | POA: Insufficient documentation

## 2015-10-30 DIAGNOSIS — M79644 Pain in right finger(s): Secondary | ICD-10-CM | POA: Insufficient documentation

## 2015-10-30 MED ORDER — NAPROXEN 500 MG PO TABS: 500.0000 mg | ORAL_TABLET | Freq: Two times a day (BID) | ORAL | 0 refills | Status: DC

## 2015-10-30 MED ORDER — NAPROXEN 250 MG PO TABS
500.0000 mg | ORAL_TABLET | Freq: Once | ORAL | Status: AC
  Administered 2015-10-30: 500 mg via ORAL
  Filled 2015-10-30: qty 2

## 2015-10-30 MED FILL — NAPROXEN 500 MG TABLET: 500 | 7 days supply | Qty: 14 | Fill #0

## 2015-10-30 NOTE — ED Notes (Signed)
Patient transported to X-ray 

## 2015-10-30 NOTE — ED Triage Notes (Signed)
C/o right hand pain mostly around thumb. No injury. No swelling..Marland Kitchen

## 2015-10-30 NOTE — ED Provider Notes (Signed)
[facility]   CSN: [id number] Arrival date & time: 10/30/15  1133     History   Chief Complaint Chief Complaint  Patient presents with  . Hand Pain    HPI Jacqueline [name] is a 55 y.o. female with history of hypertension who presents with a 2 day history of right hand pain. Patient's pain is localized mostly to proximal thumb and MCP. Patient describes her pain as soreness. Patient reports that it gradually became worse over the past 2 days. Patient states the pain is worse with movement. Patient tried ibuprofen, ice, heat yesterday with some relief. Patient states she woke up with more pain than yesterday. Patient denies known trauma or injury. She denies any numbness or tingling. She denies fevers, chest pain, shortness of breath, abdominal pain, nausea, vomiting.  HPI  Past Medical History:  Diagnosis Date  . Hypertension     There are no active problems to display for this patient.   Past Surgical History:  Procedure Laterality Date  . ABDOMINAL HYSTERECTOMY    . CESAREAN SECTION      OB History    No data available       Home Medications    Prior to Admission medications   Medication Sig Start Date End Date Taking? Authorizing Provider  amLODipine (NORVASC) 10 MG tablet Take 10 mg by mouth daily.   Yes Historical Provider, MD  atorvastatin (LIPITOR) 20 MG tablet Take 20 mg by mouth daily.   Yes Historical Provider, MD  hydrochlorothiazide (HYDRODIURIL) 12.5 MG tablet Take 25 mg by mouth daily.   Yes Historical Provider, MD  potassium chloride (KLOR-CON) 8 MEQ tablet Take 8 mEq by mouth daily.   Yes Historical Provider, MD  quinapril (ACCUPRIL) 40 MG tablet Take 40 mg by mouth at bedtime.   Yes Historical Provider, MD  naproxen (NAPROSYN) 500 MG tablet Take 1 tablet (500 mg total) by mouth 2 (two) times daily. 10/30/15   Emi Holes, PA-C    Family History No family history on file.  Social History Social History  Substance Use  Topics  . Smoking status: Never Smoker  . Smokeless tobacco: Never Used  . Alcohol use No     Allergies   Review of patient's allergies indicates no known allergies.   Review of Systems Review of Systems  Constitutional: Negative for chills and fever.  HENT: Negative for facial swelling.   Respiratory: Negative for shortness of breath.   Cardiovascular: Negative for chest pain.  Gastrointestinal: Negative for abdominal pain, nausea and vomiting.  Musculoskeletal: Positive for arthralgias.  Skin: Negative for rash and wound.  Psychiatric/Behavioral: The patient is not nervous/anxious.      Physical Exam Updated Vital Signs BP 168/88 (BP Location: Right Arm)   Pulse 94   Temp 98.1 F (36.7 C)   Resp 16   Ht 5\' 3"  (1.6 m)   Wt 104.3 kg   SpO2 97%   BMI 40.74 kg/m   Physical Exam  Constitutional: She appears well-developed and well-nourished. No distress.  HENT:  Head: Normocephalic and atraumatic.  Mouth/Throat: Oropharynx is clear and moist. No oropharyngeal exudate.  Eyes: Conjunctivae are normal. Pupils are equal, round, and reactive to light. Right eye exhibits no discharge. Left eye exhibits no discharge. No scleral icterus.  Neck: Normal range of motion. Neck supple. No thyromegaly present.  Cardiovascular: Normal rate, regular rhythm, normal heart sounds and intact distal pulses.  Exam reveals no gallop and no friction rub.  No murmur heard. Pulmonary/Chest: Effort normal and breath sounds normal. No stridor. No respiratory distress. She has no wheezes. She has no rales.  Abdominal: Soft. Bowel sounds are normal. She exhibits no distension. There is no tenderness. There is no rebound and no guarding.  Musculoskeletal: She exhibits no edema.       Right hand: She exhibits tenderness and bony tenderness. She exhibits normal range of motion and normal capillary refill. Normal sensation noted. Normal strength noted.       Hands: R hand: Tenderness to the MCP,  metacarpal and thenar eminence; negative Phalen's and Tinel's test; normal sensation; flexion and extension intact; cap refill < 2 secs; no warmth or erythema of the area  Lymphadenopathy:    She has no cervical adenopathy.  Neurological: She is alert. Coordination normal.  Skin: Skin is warm and dry. No rash noted. She is not diaphoretic. No pallor.  Psychiatric: She has a normal mood and affect.  Nursing note and vitals reviewed.    ED Treatments / Results  Labs (all labs ordered are listed, but only abnormal results are displayed) Labs Reviewed - No data to display  EKG  EKG Interpretation None       Radiology Dg Hand Complete Right  Result Date: 10/30/2015 CLINICAL DATA:  Right hand pain for 2 days, right thumb pain, no known injury EXAM: RIGHT HAND - COMPLETE 3+ VIEW COMPARISON:  None. FINDINGS: Three views of the right hand submitted. No acute fracture or subluxation. No periosteal reaction or bony erosion. No radiopaque foreign body. IMPRESSION: Negative. Electronically Signed   By: Natasha MeadLiviu  Pop M.D.   On: 10/30/2015 12:14    Procedures Procedures (including critical care time)  Medications Ordered in ED Medications  naproxen (NAPROSYN) tablet 500 mg (500 mg Oral Given 10/30/15 1206)     Initial Impression / Assessment and Plan / ED Course  I have reviewed the triage vital signs and the nursing notes.  Pertinent labs & imaging results that were available during my care of the patient were reviewed by me and considered in my medical decision making (see chart for details).  Clinical Course    R hand film negative. No signs of infection. Suspect deQuervain's tenosynovitis or early MCP osteoarthritis. Supportive treatment discussed including ice. Discharged with Naprosyn. Removable thumb spica splint given in ED. Follow up to PCP and hand surgeon, Dr. Amanda PeaGramig. Return precautions discussed. Patient understands and agrees with plan. Patient vitals stable throughout ED course  and discharged in satisfactory condition. Patient discussed with Dr. Anitra LauthPlunkett who agrees with plan.  Final Clinical Impressions(s) / ED Diagnoses   Final diagnoses:  Right hand pain  Pain of right thumb    New Prescriptions Discharge Medication List as of 10/30/2015  1:12 PM    START taking these medications   Details  naproxen (NAPROSYN) 500 MG tablet Take 1 tablet (500 mg total) by mouth 2 (two) times daily., Starting Tue 10/30/2015, Print         Emi Holeslexandra M Leonilda Cozby, PA-C 10/31/15 1423    Gwyneth SproutWhitney Plunkett, MD 11/03/15 [phone]

## 2015-10-30 NOTE — Discharge Instructions (Signed)
Medications: Naprosyn  Treatment: Take Naprosyn twice daily for 1 week. Use ice 3-4 times daily alternating 20 minutes on, 20 minutes off. Wear your splint at all times, except with bathing.  Follow-up: Please follow-up with your primary care provider in the next few days. Please follow-up with Dr. Amanda PeaGramig, a hand doctor, if your symptoms are not improving. Please return to the emergency department if you develop any new or worsening symptoms.

## 2017-11-09 ENCOUNTER — Other Ambulatory Visit: Payer: Self-pay | Admitting: Family Medicine

## 2017-11-09 DIAGNOSIS — E7800 Pure hypercholesterolemia, unspecified: Secondary | ICD-10-CM | POA: Diagnosis not present

## 2017-11-09 DIAGNOSIS — Z1231 Encounter for screening mammogram for malignant neoplasm of breast: Secondary | ICD-10-CM

## 2017-11-09 DIAGNOSIS — I10 Essential (primary) hypertension: Secondary | ICD-10-CM | POA: Diagnosis not present

## 2017-11-09 DIAGNOSIS — E119 Type 2 diabetes mellitus without complications: Secondary | ICD-10-CM | POA: Diagnosis not present

## 2017-12-09 ENCOUNTER — Ambulatory Visit
Admission: RE | Admit: 2017-12-09 | Discharge: 2017-12-09 | Disposition: A | Discharge status: Home / Self Care | Payer: 59 | Source: Ambulatory Visit | Attending: Family Medicine | Admitting: Family Medicine

## 2017-12-09 DIAGNOSIS — Z1231 Encounter for screening mammogram for malignant neoplasm of breast: Secondary | ICD-10-CM

## 2019-02-07 ENCOUNTER — Other Ambulatory Visit: Payer: Self-pay | Admitting: Family Medicine

## 2019-02-07 DIAGNOSIS — Z1231 Encounter for screening mammogram for malignant neoplasm of breast: Secondary | ICD-10-CM

## 2019-02-09 ENCOUNTER — Ambulatory Visit
Admission: RE | Admit: 2019-02-09 | Discharge: 2019-02-09 | Disposition: A | Discharge status: Home / Self Care | Payer: 59 | Source: Ambulatory Visit | Attending: Family Medicine | Admitting: Family Medicine

## 2019-02-09 ENCOUNTER — Other Ambulatory Visit: Payer: Self-pay

## 2019-02-09 DIAGNOSIS — Z1231 Encounter for screening mammogram for malignant neoplasm of breast: Secondary | ICD-10-CM

## 2019-09-09 ENCOUNTER — Ambulatory Visit: Payer: No Typology Code available for payment source | Admitting: Podiatry

## 2019-09-22 ENCOUNTER — Other Ambulatory Visit: Payer: Self-pay

## 2019-09-22 ENCOUNTER — Other Ambulatory Visit: Payer: Self-pay | Admitting: Podiatry

## 2019-09-22 ENCOUNTER — Ambulatory Visit (INDEPENDENT_AMBULATORY_CARE_PROVIDER_SITE_OTHER): Payer: No Typology Code available for payment source

## 2019-09-22 ENCOUNTER — Ambulatory Visit: Payer: No Typology Code available for payment source | Admitting: Podiatry

## 2019-09-22 DIAGNOSIS — M79672 Pain in left foot: Secondary | ICD-10-CM | POA: Diagnosis not present

## 2019-09-22 DIAGNOSIS — M216X9 Other acquired deformities of unspecified foot: Secondary | ICD-10-CM

## 2019-09-22 DIAGNOSIS — M722 Plantar fascial fibromatosis: Secondary | ICD-10-CM

## 2019-09-22 DIAGNOSIS — M7732 Calcaneal spur, left foot: Secondary | ICD-10-CM

## 2019-09-22 NOTE — Progress Notes (Signed)
°  Subjective:  Patient ID: Jacqueline [name], female    DOB: Jul 28, 1960,  MRN: [id number]  Chief Complaint  Patient presents with   Foot Pain    pt is here for left foot pain. Pt states that the foot is elevated to the touch. Pt states that she has bil swelling, but states that her right side is not hurting    59 y.o. female presents with the above complaint. History confirmed with patient.   Objective:  Physical Exam: warm, good capillary refill, no trophic changes or ulcerative lesions, normal DP and PT pulses and normal sensory exam. Left Foot: tenderness to palpation medial calcaneal tuber, no pain with calcaneal squeeze, decreased ankle joint ROM and +Silverskiold test normal exam, no swelling, tenderness, instability; ligaments intact, full range of motion of all ankle/foot joints other than findings noted above.  Radiographs: X-ray of the left foot: no evidence of calcaneal stress fracture and plantar calcaneal spur  Assessment:   1. Plantar fasciitis   2. Equinus deformity of foot   3. Calcaneal spur of left foot   4. Pain of left heel      Plan:  Patient was evaluated and treated and all questions answered.  Plantar Fasciitis -XR reviewed with patient -Educated patient on stretching and icing of the affected limb -Plantar fascial brace dispensed -Injection delivered to the plantar fascia of the left foot.  Procedure: Injection Tendon/Ligament Consent: Verbal consent obtained. Location: Left plantar fascia at the glabrous junction; medial approach. Skin Prep: Alcohol. Injectate: 1 cc 0.5% marcaine plain, 1 cc dexamethasone phosphate, 0.5 cc kenalog 10. Disposition: Patient tolerated procedure well. Injection site dressed with a band-aid.    Return in about 4 weeks (around 10/20/2019) for Plantar fasciitis.

## 2019-09-22 NOTE — Patient Instructions (Addendum)

## 2019-10-25 ENCOUNTER — Ambulatory Visit: Payer: No Typology Code available for payment source | Admitting: Podiatry

## 2019-11-11 ENCOUNTER — Ambulatory Visit: Payer: No Typology Code available for payment source | Admitting: Podiatry

## 2020-02-09 ENCOUNTER — Other Ambulatory Visit: Payer: Self-pay

## 2020-02-09 DIAGNOSIS — Z1231 Encounter for screening mammogram for malignant neoplasm of breast: Secondary | ICD-10-CM

## 2020-03-21 ENCOUNTER — Ambulatory Visit
Admission: RE | Admit: 2020-03-21 | Discharge: 2020-03-21 | Disposition: A | Discharge status: Home / Self Care | Payer: 59 | Source: Ambulatory Visit | Attending: Family Medicine | Admitting: Family Medicine

## 2020-03-21 ENCOUNTER — Other Ambulatory Visit: Payer: Self-pay

## 2020-03-21 DIAGNOSIS — Z1231 Encounter for screening mammogram for malignant neoplasm of breast: Secondary | ICD-10-CM

## 2021-07-18 IMAGING — MG MM DIGITAL SCREENING BILAT W/ TOMO AND CAD
8 series · 8 of 24 positions shown · non-contrast
Comparison: Previous exam(s).

CLINICAL DATA: Screening.

EXAM:
DIGITAL SCREENING BILATERAL MAMMOGRAM WITH TOMO AND CAD

[R CC synth-2D]
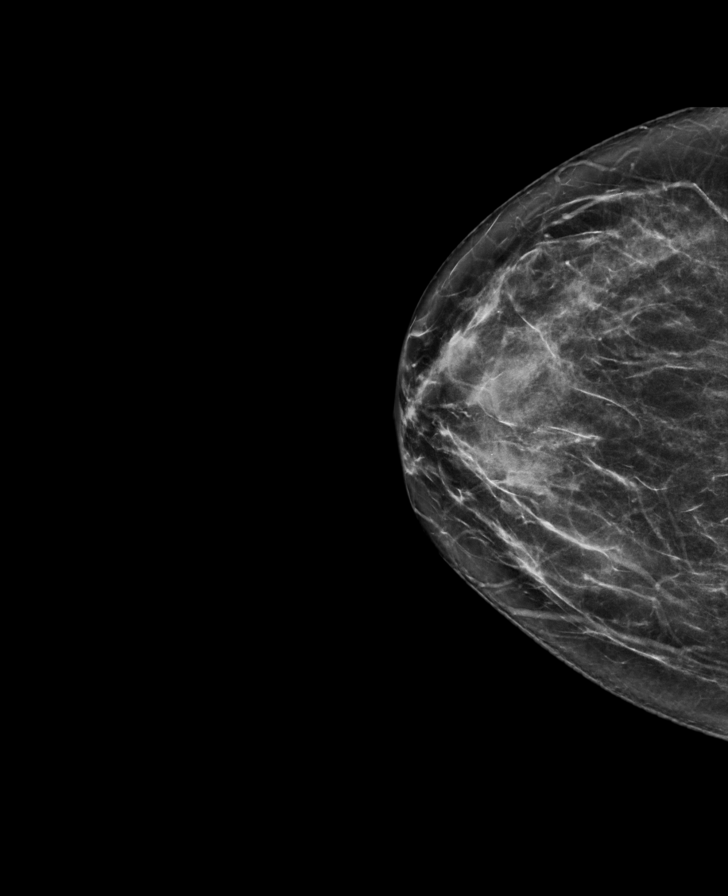

[R MLO synth-2D]
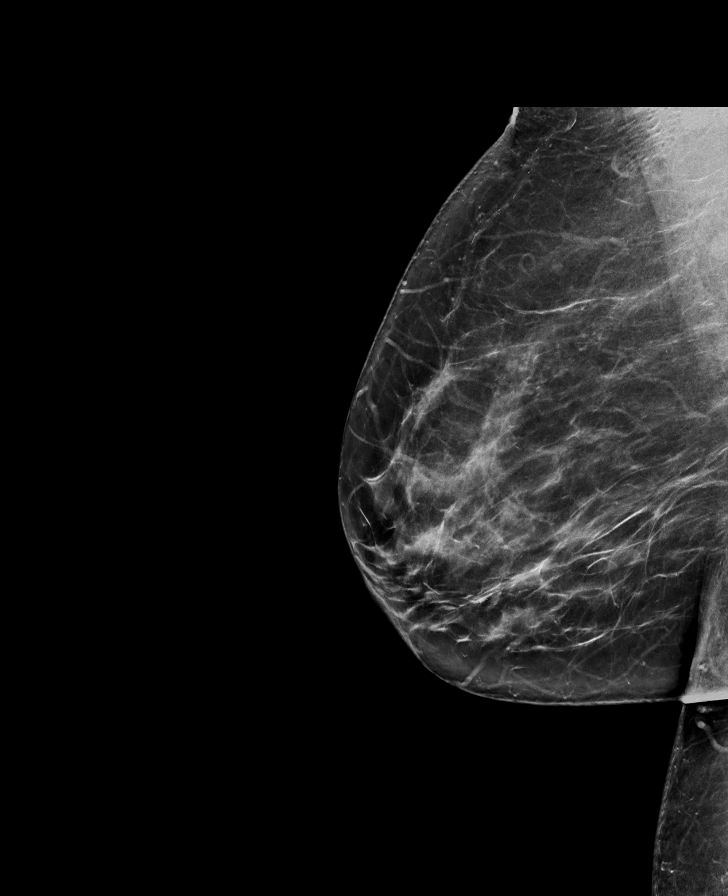

[L CC synth-2D]
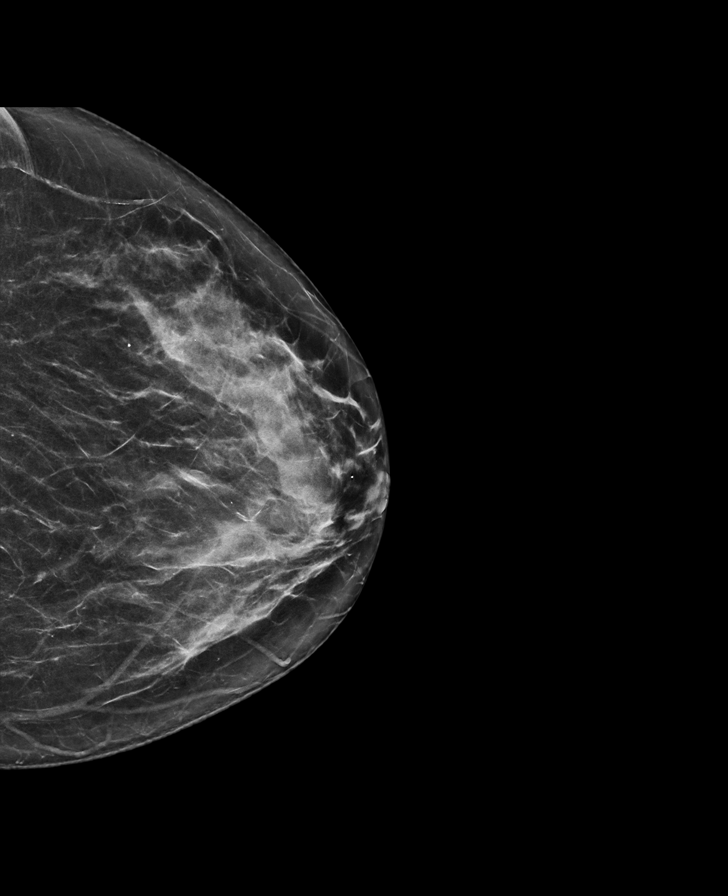

[L MLO synth-2D]
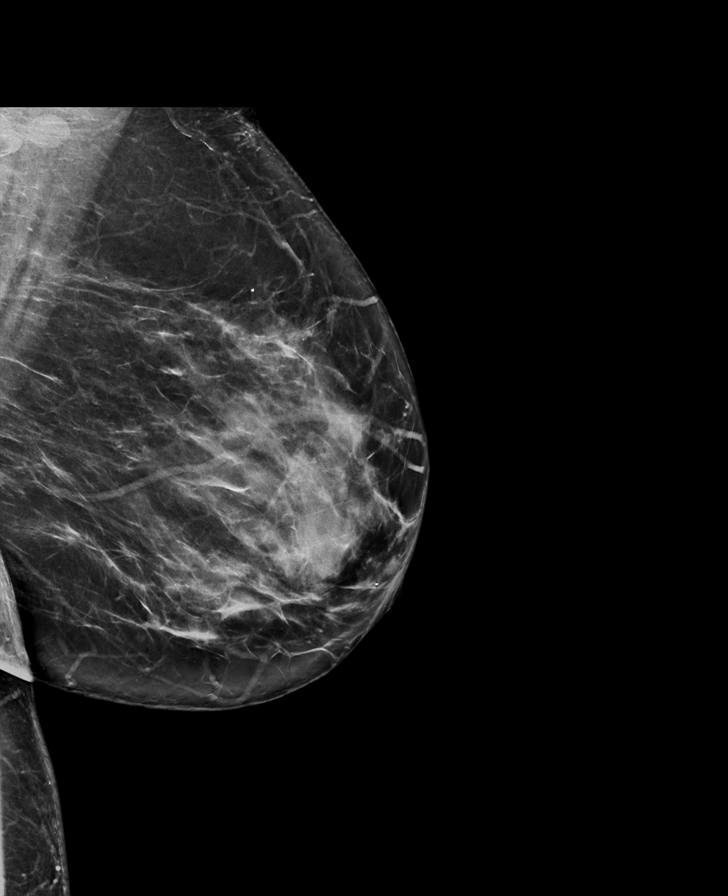

[L CC tomo · tomo slice 37/73.0]
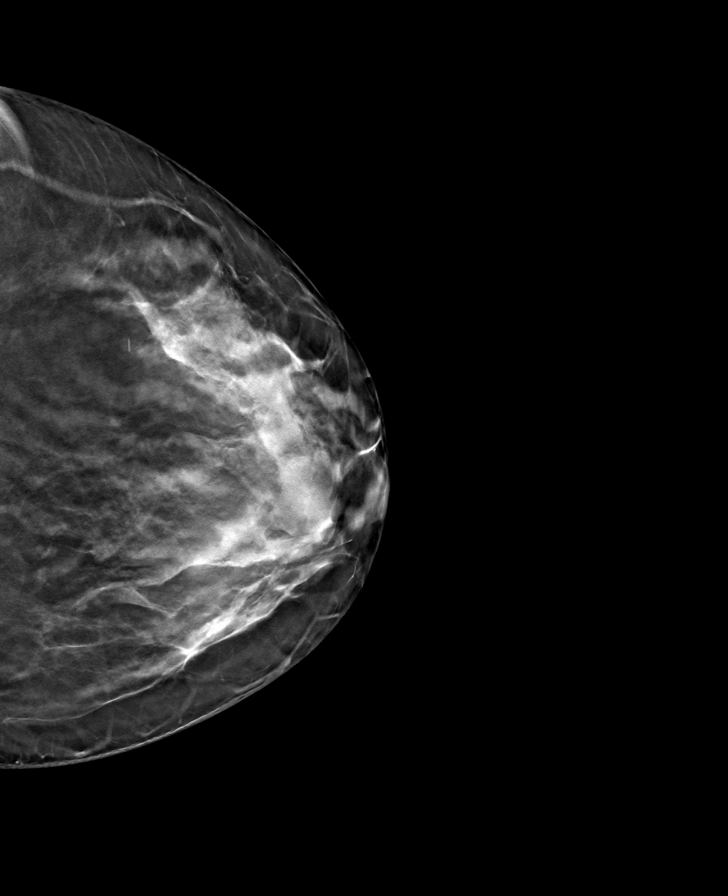

[R CC tomo · tomo slice 37/72.0]
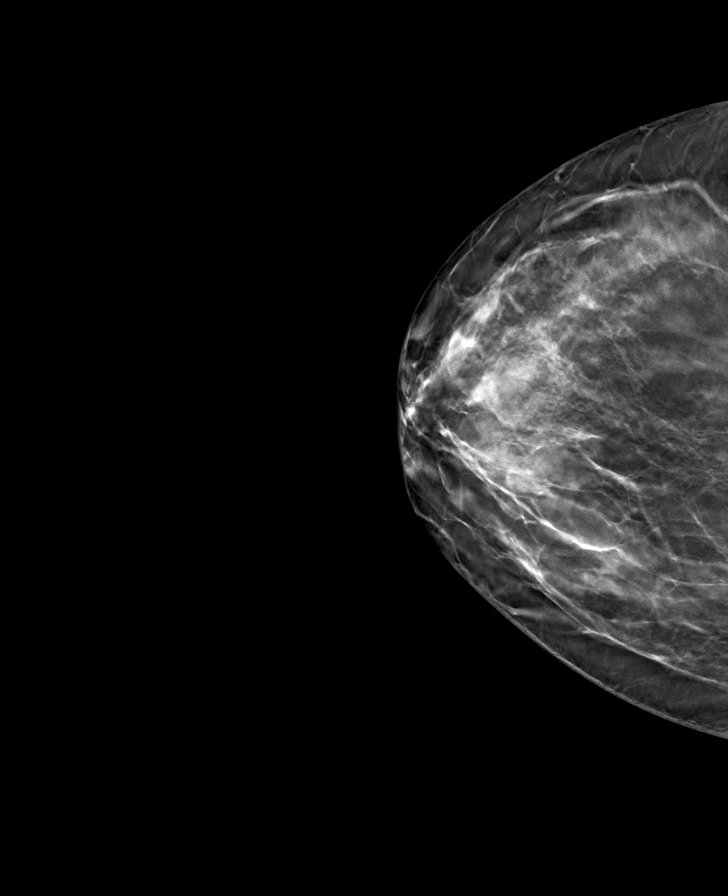

[R MLO tomo · tomo slice 41/81.0]
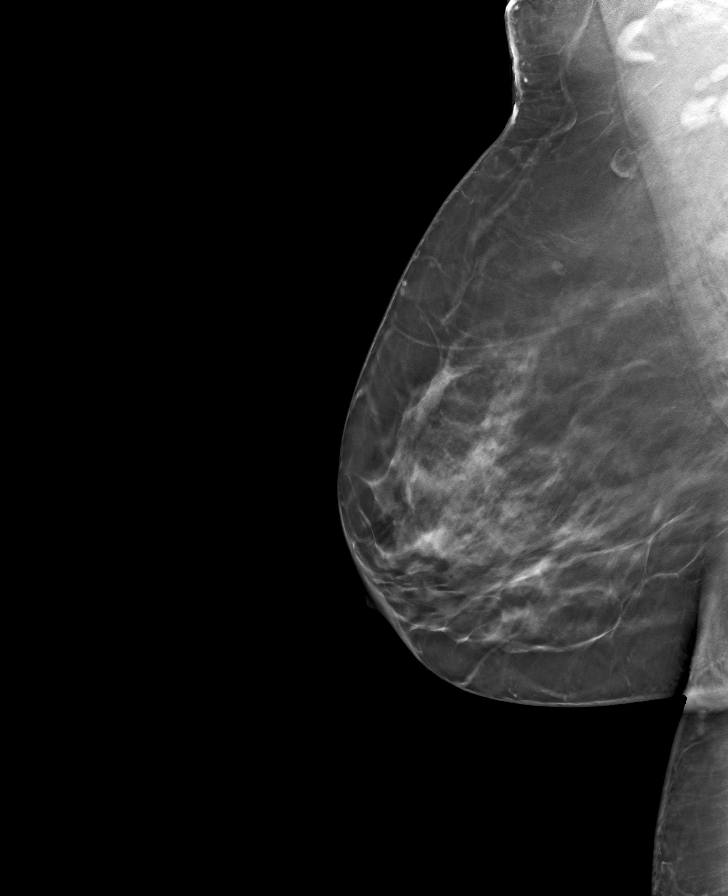

[L MLO tomo · tomo slice 41/81.0]
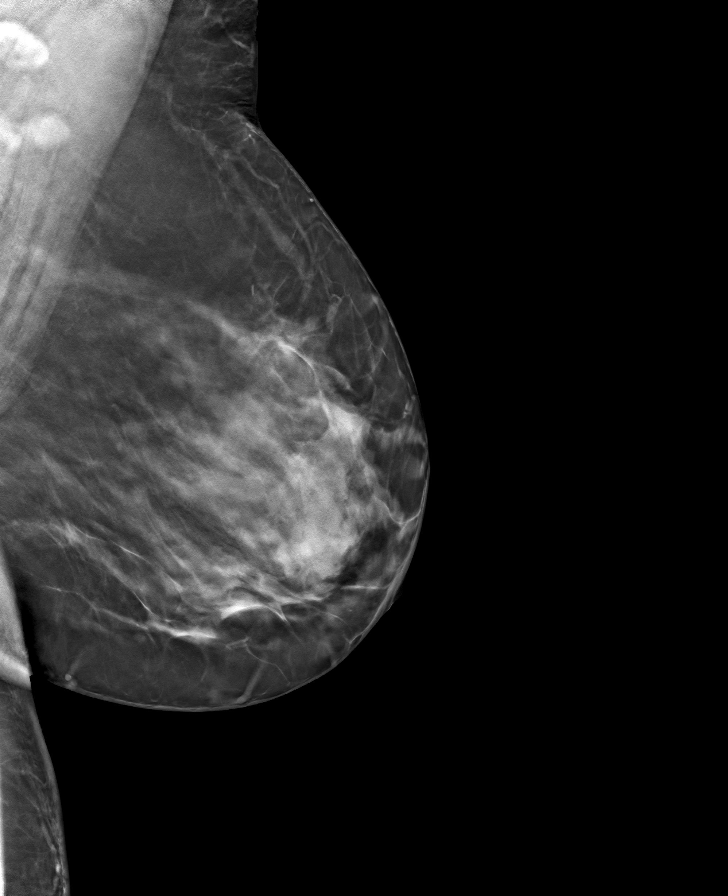

[8 of 24 positions shown; findings below may reference images not displayed]

ACR Breast Density Category c: The breast tissue is heterogeneously
dense, which may obscure small masses.
FINDINGS: There are no findings suspicious for malignancy. The images were
evaluated with computer-aided detection.
IMPRESSION: No mammographic evidence of malignancy. A result letter of this
screening mammogram will be mailed directly to the patient.

RECOMMENDATION:
Screening mammogram in one year. (Code:JF-W-WVL)

BI-RADS CATEGORY  1: Negative.

## 2022-02-25 ENCOUNTER — Other Ambulatory Visit: Payer: Self-pay | Admitting: Family Medicine

## 2022-02-25 DIAGNOSIS — Z1231 Encounter for screening mammogram for malignant neoplasm of breast: Secondary | ICD-10-CM

## 2022-04-16 ENCOUNTER — Ambulatory Visit
Admission: RE | Admit: 2022-04-16 | Discharge: 2022-04-16 | Disposition: A | Discharge status: Home / Self Care | Payer: 59 | Source: Ambulatory Visit | Attending: Family Medicine | Admitting: Family Medicine

## 2022-04-16 DIAGNOSIS — Z1231 Encounter for screening mammogram for malignant neoplasm of breast: Secondary | ICD-10-CM

## 2022-04-29 DIAGNOSIS — J069 Acute upper respiratory infection, unspecified: Secondary | ICD-10-CM | POA: Diagnosis not present

## 2022-05-08 DIAGNOSIS — H40033 Anatomical narrow angle, bilateral: Secondary | ICD-10-CM | POA: Diagnosis not present

## 2022-05-08 DIAGNOSIS — H40013 Open angle with borderline findings, low risk, bilateral: Secondary | ICD-10-CM | POA: Diagnosis not present

## 2023-06-17 ENCOUNTER — Other Ambulatory Visit: Payer: Self-pay | Admitting: Family Medicine

## 2023-06-17 DIAGNOSIS — Z1231 Encounter for screening mammogram for malignant neoplasm of breast: Secondary | ICD-10-CM

## 2023-06-23 ENCOUNTER — Ambulatory Visit
Admission: RE | Admit: 2023-06-23 | Discharge: 2023-06-23 | Disposition: A | Discharge status: Home / Self Care | Source: Ambulatory Visit | Attending: Family Medicine | Admitting: Family Medicine

## 2023-06-23 DIAGNOSIS — Z1231 Encounter for screening mammogram for malignant neoplasm of breast: Secondary | ICD-10-CM

## 2023-07-07 ENCOUNTER — Emergency Department (HOSPITAL_BASED_OUTPATIENT_CLINIC_OR_DEPARTMENT_OTHER)
Admission: EM | Admit: 2023-07-07 | Discharge: 2023-07-07 | Disposition: A | Discharge status: Home / Self Care | Attending: Emergency Medicine | Admitting: Emergency Medicine

## 2023-07-07 ENCOUNTER — Other Ambulatory Visit: Payer: Self-pay

## 2023-07-07 ENCOUNTER — Encounter (HOSPITAL_BASED_OUTPATIENT_CLINIC_OR_DEPARTMENT_OTHER): Payer: Self-pay | Admitting: Emergency Medicine

## 2023-07-07 ENCOUNTER — Emergency Department (HOSPITAL_BASED_OUTPATIENT_CLINIC_OR_DEPARTMENT_OTHER)

## 2023-07-07 DIAGNOSIS — Z79899 Other long term (current) drug therapy: Secondary | ICD-10-CM | POA: Diagnosis not present

## 2023-07-07 DIAGNOSIS — R109 Unspecified abdominal pain: Secondary | ICD-10-CM

## 2023-07-07 DIAGNOSIS — I10 Essential (primary) hypertension: Secondary | ICD-10-CM | POA: Diagnosis not present

## 2023-07-07 DIAGNOSIS — R1012 Left upper quadrant pain: Secondary | ICD-10-CM | POA: Diagnosis present

## 2023-07-07 DIAGNOSIS — R1032 Left lower quadrant pain: Secondary | ICD-10-CM | POA: Insufficient documentation

## 2023-07-07 LAB — COMPREHENSIVE METABOLIC PANEL WITH GFR
ALT: 14 U/L (ref 0–44)
AST: 21 U/L (ref 15–41)
Albumin: 4.2 g/dL (ref 3.5–5.0)
Alkaline Phosphatase: 87 U/L (ref 38–126)
Anion gap: 13 (ref 5–15)
BUN: 14 mg/dL (ref 8–23)
CO2: 25 mmol/L (ref 22–32)
Calcium: 9.9 mg/dL (ref 8.9–10.3)
Chloride: 101 mmol/L (ref 98–111)
Creatinine, Ser: 0.76 mg/dL (ref 0.44–1.00)
GFR, Estimated: >60 mL/min (ref >60)
Glucose, Bld: 115 mg/dL — ABNORMAL HIGH (ref 70–99)
Potassium: 3.8 mmol/L (ref 3.5–5.1)
Sodium: 139 mmol/L (ref 135–145)
Total Bilirubin: 0.4 mg/dL (ref 0.0–1.2)
Total Protein: 7.5 g/dL (ref 6.5–8.1)

## 2023-07-07 LAB — CBC WITH DIFFERENTIAL/PLATELET
Abs Immature Granulocytes: 0.03 10*3/uL (ref 0.00–0.07)
Basophils Absolute: 0.1 10*3/uL (ref 0.0–0.1)
Basophils Relative: 1 %
Eosinophils Absolute: 0.1 10*3/uL (ref 0.0–0.5)
Eosinophils Relative: 1 %
HCT: 39.8 % (ref 36.0–46.0)
Hemoglobin: 13.1 g/dL (ref 12.0–15.0)
Immature Granulocytes: 0 %
Lymphocytes Relative: 25 %
Lymphs Abs: 2.7 10*3/uL (ref 0.7–4.0)
MCH: 28.2 pg (ref 26.0–34.0)
MCHC: 32.9 g/dL (ref 30.0–36.0)
MCV: 85.6 fL (ref 80.0–100.0)
Monocytes Absolute: 0.9 10*3/uL (ref 0.1–1.0)
Monocytes Relative: 8 %
Neutro Abs: 7 10*3/uL (ref 1.7–7.7)
Neutrophils Relative %: 65 %
Platelets: 279 10*3/uL (ref 150–400)
RBC: 4.65 MIL/uL (ref 3.87–5.11)
RDW: 13.9 % (ref 11.5–15.5)
WBC: 10.7 10*3/uL — ABNORMAL HIGH (ref 4.0–10.5)
nRBC: 0 % (ref 0.0–0.2)

## 2023-07-07 LAB — URINALYSIS, ROUTINE W REFLEX MICROSCOPIC
Bacteria, UA: NONE SEEN
Bilirubin Urine: NEGATIVE
Glucose, UA: >1000 mg/dL — AB
Hgb urine dipstick: NEGATIVE
Ketones, ur: NEGATIVE mg/dL
Leukocytes,Ua: NEGATIVE
Nitrite: NEGATIVE
Specific Gravity, Urine: 1.032 — ABNORMAL HIGH (ref 1.005–1.030)
pH: 6 (ref 5.0–8.0)

## 2023-07-07 LAB — TROPONIN T, HIGH SENSITIVITY
Troponin T High Sensitivity: <15 ng/L (ref <19)
Troponin T High Sensitivity: <15 ng/L (ref <19)

## 2023-07-07 LAB — LIPASE, BLOOD: Lipase: 29 U/L (ref 11–51)

## 2023-07-07 MED ORDER — PANTOPRAZOLE SODIUM 20 MG PO TBEC: 40.0000 mg | DELAYED_RELEASE_TABLET | Freq: Every day | ORAL | 0 refills | Status: AC

## 2023-07-07 MED ORDER — IOHEXOL 300 MG/ML  SOLN
100.0000 mL | Freq: Once | INTRAMUSCULAR | Status: AC | PRN
  Administered 2023-07-07: 100 mL via INTRAVENOUS

## 2023-07-07 NOTE — ED Triage Notes (Signed)
 C/o left sided abd pain w/ cramping x 1 week. Sent from PCP to get US . Denies n/v/d.

## 2023-07-07 NOTE — ED Notes (Signed)
 Pt provided with a blanket

## 2023-07-07 NOTE — ED Provider Notes (Signed)
 [place] EM[facility] DEPARTMENT AT [facility] Provider Note   CSN: [id number] Arrival date & time: 07/07/23  1012     History {Add pertinent medical, surgical, social history, OB history to HPI:1} Chief Complaint  Patient presents with   Abdominal Pain    Jacqueline [name] is a 63 y.o. female.  HPI    63 year old female with a history of hypertension presents with concern for abdominal pain.   Last week, Wednesday thought it was gas, constant pain, never went away, still there but not as bad as it was.  Worse after eating.  Left side of abdomen and into back, more into the back Was hard to twist around at first, movements seemed to make it worse No fever, nausea, vomiting, diarrhea Felt more constipated, then had bm after laxative No urinary symptoms No chest pain or dyspnea Left flank into side of stomach    Past Medical History:  Diagnosis Date   Hypertension      Home Medications Prior to Admission medications   Medication Sig Start Date End Date Taking? Authorizing Provider  FARXIGA 10 MG TABS tablet Take 10 mg by mouth daily. 04/08/23  Yes [provider]  metFORMIN (GLUCOPHAGE) 1000 MG tablet Take 1,000 mg by mouth daily. *with a meal* 03/13/23  Yes [provider]  potassium chloride (KLOR-CON M) 10 MEQ tablet Take 10 mEq by mouth once. 01/15/23  Yes [provider]  amLODipine (NORVASC) 10 MG tablet Take 10 mg by mouth daily.    [provider]  atorvastatin (LIPITOR) 20 MG tablet Take 20 mg by mouth daily.    [provider]  benazepril (LOTENSIN) 40 MG tablet Take 1 tablet by mouth daily.    [provider]  hydrochlorothiazide (HYDRODIURIL) 12.5 MG tablet Take 25 mg by mouth daily.    [provider]  naproxen  (NAPROSYN ) 500 MG tablet Take 1 tablet (500 mg total) by mouth 2 (two) times daily. 10/30/15   Law, Alexandra M, PA-C  potassium chloride (KLOR-CON) 8 MEQ tablet Take 8 mEq by mouth  daily.    [provider]  quinapril (ACCUPRIL) 40 MG tablet Take 40 mg by mouth at bedtime.    [provider]      Allergies    Patient has no known allergies.    Review of Systems   Review of Systems  Physical Exam Updated Vital Signs BP 137/76 (BP Location: Right Arm)   Pulse 83   Temp 98.3 F (36.8 C) (Oral)   Resp 18   SpO2 97%  Physical Exam  ED Results / Procedures / Treatments   Labs (all labs ordered are listed, but only abnormal results are displayed) Labs Reviewed - No data to display  EKG None  Radiology No results found.  Procedures Procedures  {Document cardiac monitor, telemetry assessment procedure when appropriate:1}  Medications Ordered in ED Medications - No data to display  ED Course/ Medical Decision Making/ A&P   {   Click here for ABCD2, HEART and other calculatorsREFRESH Note before signing :1}                              Medical Decision Making Amount and/or Complexity of Data Reviewed Labs: ordered.   ***  {Document critical care time when appropriate:1} {Document review of labs and clinical decision tools ie heart score, Chads2Vasc2 etc:1}  {Document your independent review of radiology images, and any outside records:1} {  Document your discussion with family members, caretakers, and with consultants:1} {Document social determinants of health affecting pt's care:1} {Document your decision making why or why not admission, treatments were needed:1} Final Clinical Impression(s) / ED Diagnoses Final diagnoses:  None    Rx / DC Orders ED Discharge Orders     None

## 2023-07-07 NOTE — Discharge Instructions (Signed)
 Your symptoms could be muscular pain given worsening with movement, also consider gastritis or peptic ulcer disease given worsening with eating. I have given you a prescription for your stomach/an antacid that can help with gastritis.

## 2024-03-19 ENCOUNTER — Ambulatory Visit
Admission: RE | Admit: 2024-03-19 | Discharge: 2024-03-19 | Disposition: A | Discharge status: Home / Self Care | Source: Ambulatory Visit

## 2024-03-19 VITALS — BP 134/81 | HR 94 | Temp 98.7°F | Resp 18

## 2024-03-19 DIAGNOSIS — J0101 Acute recurrent maxillary sinusitis: Secondary | ICD-10-CM

## 2024-03-19 HISTORY — DX: Type 2 diabetes mellitus without complications: E11.9

## 2024-03-19 MED ORDER — AMOXICILLIN-POT CLAVULANATE 875-125 MG PO TABS: 1.0000 | ORAL_TABLET | Freq: Two times a day (BID) | ORAL | 0 refills | Status: AC

## 2024-03-19 NOTE — ED Provider Notes (Signed)
 " [facility]    CSN: [id number] Arrival date & time: 03/19/24  0844      History   Chief Complaint Chief Complaint  Patient presents with   Nasal Congestion    HPI Jacqueline [name] is a 64 y.o. female.   Pt, with a hx of seasonal allergies and chronic sinus issues, presents today due to worsening sinus congestion for the past week and epistaxis last night. Pt states that she had a nosebleed last night that took her 30 mins to stop. Pt denies use of blood thinners. Pt states that she used Zyrtec and vicks formula 44. Pt denies use of flonase or sinus rinse for symptoms.   The history is provided by the patient.    Past Medical History:  Diagnosis Date   Diabetes mellitus without complication (HCC)    Hypertension     There are no active problems to display for this patient.   Past Surgical History:  Procedure Laterality Date   ABDOMINAL HYSTERECTOMY     CESAREAN SECTION      OB History   No obstetric history on file.      Home Medications    Prior to Admission medications  Medication Sig Start Date End Date Taking? Authorizing Provider  amLODipine (NORVASC) 10 MG tablet Take 10 mg by mouth daily.   Yes [provider]  amoxicillin -clavulanate (AUGMENTIN ) 875-125 MG tablet Take 1 tablet by mouth every 12 (twelve) hours for 10 days. 03/19/24 03/29/24 Yes Andra Corean BROCKS, PA-C  aspirin 81 MG chewable tablet Chew 81 mg by mouth daily. 05/24/14  Yes [provider]  benazepril (LOTENSIN) 40 MG tablet Take 1 tablet by mouth daily.   Yes [provider]  FARXIGA 10 MG TABS tablet Take 10 mg by mouth daily. 04/08/23  Yes [provider]  hydrochlorothiazide (HYDRODIURIL) 12.5 MG tablet Take 25 mg by mouth daily.   Yes [provider]  metFORMIN (GLUCOPHAGE) 1000 MG tablet Take 1,000 mg by mouth daily. *with a meal* 03/13/23  Yes [provider]  Multiple Vitamin (MULTIVITAMIN) tablet Take 1 tablet by mouth  daily.   Yes [provider]  potassium chloride (KLOR-CON M) 10 MEQ tablet Take 10 mEq by mouth once. 01/15/23  Yes [provider]  quinapril (ACCUPRIL) 40 MG tablet Take 40 mg by mouth at bedtime.   Yes [provider]  atorvastatin (LIPITOR) 20 MG tablet Take 20 mg by mouth daily. Patient not taking: Reported on 03/19/2024    [provider]  pantoprazole  (PROTONIX ) 20 MG tablet Take 2 tablets (40 mg total) by mouth daily for 14 days. Patient not taking: Reported on 03/19/2024 07/07/23 07/21/23  Dreama Longs, MD  pioglitazone (ACTOS) 15 MG tablet Take 15 mg by mouth daily. Patient not taking: Reported on 03/19/2024    [provider]  potassium chloride (KLOR-CON) 8 MEQ tablet Take 8 mEq by mouth daily. Patient not taking: Reported on 03/19/2024    [provider]    Family History History reviewed. No pertinent family history.  Social History Social History[1]   Allergies   Dulaglutide   Review of Systems Review of Systems   Physical Exam Triage Vital Signs ED Triage Vitals  Encounter Vitals Group     BP 03/19/24 0924 134/81     Girls Systolic BP Percentile --      Girls Diastolic BP Percentile --      Boys Systolic BP Percentile --  Boys Diastolic BP Percentile --      Pulse Rate 03/19/24 0924 94     Resp 03/19/24 0924 18     Temp 03/19/24 0924 98.7 F (37.1 C)     Temp Source 03/19/24 0924 Oral     SpO2 03/19/24 0924 97 %     Weight --      Height --      Head Circumference --      Peak Flow --      Pain Score 03/19/24 0920 2     Pain Loc --      Pain Education --      Exclude from Growth Chart --    No data found.  Updated Vital Signs BP 134/81 (BP Location: Left Arm)   Pulse 94   Temp 98.7 F (37.1 C) (Oral)   Resp 18   SpO2 97%   Visual Acuity Right Eye Distance:   Left Eye Distance:   Bilateral Distance:    Right Eye Near:   Left Eye Near:    Bilateral Near:     Physical  Exam Vitals and nursing note reviewed.  Constitutional:      General: She is not in acute distress.    Appearance: Normal appearance. She is not ill-appearing, toxic-appearing or diaphoretic.  HENT:     Nose: Congestion (moderately enlarged turbinates) present. No rhinorrhea.     Right Sinus: Maxillary sinus tenderness present. No frontal sinus tenderness.     Left Sinus: Maxillary sinus tenderness present. No frontal sinus tenderness.     Comments: No tenderness to palpation of upper lip    Mouth/Throat:     Mouth: Mucous membranes are moist.     Pharynx: Oropharynx is clear. No oropharyngeal exudate or posterior oropharyngeal erythema.  Eyes:     General: No scleral icterus. Cardiovascular:     Rate and Rhythm: Normal rate and regular rhythm.     Heart sounds: Normal heart sounds.  Pulmonary:     Effort: Pulmonary effort is normal. No respiratory distress.     Breath sounds: Normal breath sounds. No wheezing or rhonchi.  Skin:    General: Skin is warm.  Neurological:     Mental Status: She is alert and oriented to person, place, and time.  Psychiatric:        Mood and Affect: Mood normal.        Behavior: Behavior normal.      UC Treatments / Results  Labs (all labs ordered are listed, but only abnormal results are displayed) Labs Reviewed - No data to display  EKG   Radiology No results found.  Procedures Procedures (including critical care time)  Medications Ordered in UC Medications - No data to display  Initial Impression / Assessment and Plan / UC Course  I have reviewed the triage vital signs and the nursing notes.  Pertinent labs & imaging results that were available during my care of the patient were reviewed by me and considered in my medical decision making (see chart for details).     Final Clinical Impressions(s) / UC Diagnoses   Final diagnoses:  Acute recurrent maxillary sinusitis     Discharge Instructions      You have been diagnosed  with a sinus infection today, some are caused by viruses and others are caused by bacteria.  If your symptoms have been going on for less than 7 days it is most likely that you have a viral sinus infection.  Antibiotics will  not work for this and it will have to run its course.  Sinus rinses (using a Nettie pot) or saline rinses are helpful as well as pseudoephedrine, and nasal sprays along with ibuprofen and Tylenol  for pain.  If you have had your symptoms for more than 7 days you most likely have a bacterial infection and will be prescribed antibiotics.  Supportive measures given for viral sinus infections will also be helpful for bacterial infections.  If you are using antibiotics you should start to feel better in 2 to 3 days but it is important that you complete antibiotics in their entirety.     ED Prescriptions     Medication Sig Dispense Auth. Provider   amoxicillin -clavulanate (AUGMENTIN ) 875-125 MG tablet Take 1 tablet by mouth every 12 (twelve) hours for 10 days. 20 tablet Andra Corean BROCKS, PA-C      PDMP not reviewed this encounter.    [1]  Social History Tobacco Use   Smoking status: Never   Smokeless tobacco: Never  Vaping Use   Vaping status: Never Used  Substance Use Topics   Alcohol use: Yes    Comment: Rare wine   Drug use: Never     Andra Corean BROCKS, PA-C 03/19/24 1001  "

## 2024-03-19 NOTE — ED Triage Notes (Signed)
 Excessive nose bleeding - Entered by patient  Pt reports sinus congestion - chronic, worse over the last week This week she has been having intermittent nosebleeds- about 3 or 4. She has tried zyrtec, coricidin, and vicks. Denies fever.

## 2024-03-19 NOTE — Discharge Instructions (Signed)
